# Patient Record
Sex: Male | Born: 2005 | Race: Black or African American | Hispanic: No | Marital: Single | Smoking: Never smoker
Health system: Southern US, Community
[De-identification: ages and names within clinical notes are randomized; demographics above are authoritative.]

## PROBLEM LIST (undated history)

## (undated) HISTORY — PX: NO PAST SURGERIES: SHX2092

---
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
## 2023-05-07 ENCOUNTER — Ambulatory Visit (INDEPENDENT_AMBULATORY_CARE_PROVIDER_SITE_OTHER): Payer: Medicaid Other | Admitting: Pediatrics

## 2023-05-07 ENCOUNTER — Encounter: Payer: Self-pay | Admitting: Pediatrics

## 2023-05-07 VITALS — BP 122/74 | HR 74 | Ht 70.87 in | Wt 263.2 lb

## 2023-05-07 DIAGNOSIS — Z713 Dietary counseling and surveillance: Secondary | ICD-10-CM | POA: Diagnosis not present

## 2023-05-07 DIAGNOSIS — Z0000 Encounter for general adult medical examination without abnormal findings: Secondary | ICD-10-CM

## 2023-05-07 DIAGNOSIS — Z1331 Encounter for screening for depression: Secondary | ICD-10-CM | POA: Diagnosis not present

## 2023-05-07 DIAGNOSIS — E6609 Other obesity due to excess calories: Secondary | ICD-10-CM | POA: Diagnosis not present

## 2023-05-07 DIAGNOSIS — Z6830 Body mass index (BMI) 30.0-30.9, adult: Secondary | ICD-10-CM | POA: Diagnosis not present

## 2023-05-07 DIAGNOSIS — E66811 Obesity, class 1: Secondary | ICD-10-CM

## 2023-05-07 DIAGNOSIS — Z113 Encounter for screening for infections with a predominantly sexual mode of transmission: Secondary | ICD-10-CM | POA: Diagnosis not present

## 2023-05-07 DIAGNOSIS — Z00121 Encounter for routine child health examination with abnormal findings: Secondary | ICD-10-CM

## 2023-05-07 NOTE — Progress Notes (Unsigned)
 Frederick Wagner is a 18 y.o. who presents for a well check. Patient is accompanied by Mother French Ana. Patient and guardian are historians during today's visit.   SUBJECTIVE:  CONCERNS:   None  NUTRITION:   Milk:  Low fat milk, 1 cup occasionally  Soda/Juice/Gatorade:  1 cup Water:  2-3 cups Solids:  Eats fruits, some vegetables, chicken, meats, fish, eggs, beans  EXERCISE:  Works at [employer] and [employer], Artist  ELIMINATION:  Voids multiple times a day; Firm stools every    HOME LIFE:      Patient lives at home with mother, father, brother. Feels safe at home. No guns in the house.  SLEEP:   8 hours SAFETY:  Wears seat belt all the time.   PEER RELATIONS:  Socializes well. (+) Social media  PHQ-9 Adolescent:    03/19/2021    3:30 PM 05/07/2023   11:13 AM  PHQ-Adolescent  Down, depressed, hopeless 0 0  Decreased interest 0 0  Altered sleeping 0 1  Change in appetite 0 0  Tired, decreased energy 0 0  Feeling bad or failure about yourself 0 0  Trouble concentrating 0 0  Moving slowly or fidgety/restless 0 0  Suicidal thoughts 0   PHQ-Adolescent Score 0 1  In the past year have you felt depressed or sad most days, even if you felt okay sometimes? No   If you are experiencing any of the problems on this form, how difficult have these problems made it for you to do your work, take care of things at home or get along with other people? Not difficult at all   Has there been a time in the past month when you have had serious thoughts about ending your own life? No   Have you ever, in your whole life, tried to kill yourself or made a suicide attempt? No       DEVELOPMENT:  WORK: [employer] DRIVING:  yes  Social History   Tobacco Use   Smoking status: Never   Smokeless tobacco: Never  Substance Use Topics   Alcohol use: No   Drug use: No    Social History   Substance and Sexual Activity  Sexual Activity Not on file    History reviewed. No pertinent past  medical history.   Past Surgical History:  Procedure Laterality Date   NO PAST SURGERIES       Family History  Problem Relation Age of Onset   Hypertension Maternal Grandmother     No Known Allergies  Current Outpatient Medications  Medication Sig Dispense Refill   Cholecalciferol (VITAMIN D) 50 MCG (2000 UT) CAPS Take 1 capsule (2,000 Units total) by mouth daily. 30 capsule 2   No current facility-administered medications for this visit.       Review of Systems  Constitutional: Negative.  Negative for activity change and fever.  HENT: Negative.  Negative for ear pain, rhinorrhea and sore throat.   Eyes: Negative.  Negative for pain.  Respiratory: Negative.  Negative for cough, chest tightness and shortness of breath.   Cardiovascular: Negative.  Negative for chest pain.  Gastrointestinal: Negative.  Negative for abdominal pain, constipation, diarrhea and vomiting.  Endocrine: Negative.   Genitourinary: Negative.  Negative for difficulty urinating.  Musculoskeletal: Negative.  Negative for joint swelling.  Skin: Negative.  Negative for rash.  Neurological: Negative.  Negative for headaches.  Psychiatric/Behavioral: Negative.       OBJECTIVE:  Wt Readings from Last 3  Encounters:  05/07/23 263 lb 3.2 oz (119.4 kg) (>99%, Z= 2.61)*  04/23/22 268 lb 3.2 oz (121.7 kg) (>99%, Z= 2.69)*  03/07/22 275 lb 9.6 oz (125 kg) (>99%, Z= 2.78)*   * Growth percentiles are based on CDC (Boys, 2-20 Years) data.   Ht Readings from Last 3 Encounters:  05/07/23 5' 10.87" (1.8 m) (68%, Z= 0.46)*  04/23/22 5' 11.26" (1.81 m) (74%, Z= 0.65)*  03/07/22 5' 11.26" (1.81 m) (75%, Z= 0.66)*   * Growth percentiles are based on CDC (Boys, 2-20 Years) data.    Body mass index is 36.85 kg/m.   98 %ile (Z= 2.13) based on CDC (Boys, 2-20 Years) BMI-for-age based on BMI available on 05/07/2023.  VITALS:  Blood pressure 122/74, pulse 74, height 5' 10.87" (1.8 m), weight 263 lb 3.2 oz (119.4 kg),  SpO2 98%.   Hearing Screening   500Hz  1000Hz  2000Hz  3000Hz  4000Hz  5000Hz  6000Hz  8000Hz   Right ear 20 20 20 20 20 20 20 20   Left ear 20 20 20 20 20 20 20 20    Vision Screening   Right eye Left eye Both eyes  Without correction 20/20 20/20 20/20   With correction        PHYSICAL EXAM: GEN:  Alert, active, no acute distress PSYCH:  Mood: pleasant;  Affect:  full range HEENT:  Normocephalic.  Atraumatic. Optic discs sharp bilaterally. Pupils equally round and reactive to light.  Extraoccular muscles intact.  Tympanic canals clear. Tympanic membranes are pearly gray bilaterally.   Turbinates:  normal ; Tongue midline. No pharyngeal lesions.  Dentition _ NECK:  Supple. Full range of motion.  No thyromegaly.  No lymphadenopathy. CARDIOVASCULAR:  Normal S1, S2.  No murmurs.   CHEST: Normal shape.  SMR _   LUNGS: Clear to auscultation.   ABDOMEN:  Normoactive polyphonic bowel sounds.  No masses.  No hepatosplenomegaly. EXTERNAL GENITALIA:  Normal SMR _ EXTREMITIES:  Full ROM. No cyanosis.  No edema. SKIN:  Well perfused.  No rash NEURO:  +5/5 Strength. CN II-XII intact. Normal gait cycle.   SPINE:  No deformities.  No scoliosis.    ASSESSMENT/PLAN:    Frederick Wagner is a 18 y.o. teen here for [facility]. Patient is alert, active and in NAD. Passed hearing and vision screen. Growth curve reviewed. Immunizations today.   PHQ-9 reviewed with patient. No suicidal or homicidal ideations.   GC/Ch screen sent. Results will be discussed with patient.    IMMUNIZATIONS:  Handout (VIS) provided for each vaccine for the parent to review during this visit. Indications, benefits, contraindications, and side effects of vaccines discussed with parent.  Parent verbally expressed understanding.  Parent _ to the administration of vaccine/vaccines as ordered today.   No orders of the defined types were placed in this encounter.   Lab Orders  No laboratory test(s) ordered today     Anticipatory Guidance     -  Handout on Young Adult Safety given.      - Discussed growth, diet, and exercise.    - Discussed social media use and limiting screen time to 2 hours daily.    - Discussed dangers of substance use.    - Discussed lifelong adult responsibility of pregnancy, STDs, and safe sex practices including abstinence.     - Taught self-breast exam.  Taught self-testicular exam.

## 2023-05-07 NOTE — Patient Instructions (Signed)
 Well Child Nutrition, Teen The following information provides general nutrition recommendations. Talk with a health care provider or a diet and nutrition specialist (dietitian) if you have any questions. Nutrition  The amount of food you need to eat every day depends on your age, sex, size, and activity level. To figure out your daily calorie needs, look for a calorie calculator online or talk with your health care provider. Balanced diet Eat a balanced diet. Try to include: Fruits. Aim for 1-2 cups a day. Examples of 1 cup of fruit include 1 large banana, 1 small apple, 8 large strawberries, 1 large orange,  cup (80 g) dried fruit, or 1 cup (250 mL) of 100% fruit juice. Try to eat fresh or frozen fruits, and avoid fruits that have added sugars. Vegetables. Aim for 2-4 cups a day. Examples of 1 cup of vegetables include 2 medium carrots, 1 large tomato, 2 stalks of celery, or 2 cups (62 g) of raw leafy greens. Try to eat vegetables with a variety of colors. Low-fat or fat-free dairy. Aim for 3 cups a day. Examples of 1 cup of dairy include 8 oz (230 mL) of milk, 8 oz (230 g) of yogurt, or 1 oz (44 g) of natural cheese. Getting enough calcium and vitamin D is important for growth and healthy bones. If you are unable to tolerate dairy (lactose intolerant) or you choose not to consume dairy, you may include fortified soy beverages (soy milk). Grains. Aim for 6-10 "ounce-equivalents" of grain foods (such as pasta, rice, and tortillas) a day. Examples of 1 ounce-equivalent of grains include 1 cup (60 g) of ready-to-eat cereal,  cup (79 g) of cooked rice, or 1 slice of bread. Of the grain foods that you eat each day, aim to include 3-5 ounce-equivalents of whole-grain options. Examples of whole grains include whole wheat, brown rice, wild rice, quinoa, and oats. Lean proteins. Aim for 5-7 ounce-equivalents a day. Eat a variety of protein foods, including lean meats, seafood, poultry, eggs, legumes (beans  and peas), nuts, seeds, and soy products. A cut of meat or fish that is the size of a deck of cards is about 3-4 ounce-equivalents (85 g). Foods that provide 1 ounce-equivalent of protein include 1 egg,  oz (28 g) of nuts or seeds, or 1 tablespoon (16 g) of peanut butter. For more information and options for foods in a balanced diet, visit www.Dis[employer]Nylon.be Tips for healthy snacking A snack should not be the size of a full meal. Eat snacks that have 200 calories or less. Examples include:  whole-wheat pita with  cup (40 g) hummus. 2 or 3 slices of deli [place] wrapped around one cheese stick.  apple with 1 tablespoon (16 g) of peanut butter. 10 baked chips with salsa. Keep cut-up fruits and vegetables available at home and at school so they are easy to eat. Pack healthy snacks the night before or when you pack your lunch. Avoid pre-packaged foods. These tend to be higher in fat, sugar, and salt (sodium). Get involved with shopping, or ask the main food shopper in your family to get healthy snacks that you like. Avoid chips, candy, cake, and soft drinks. Foods to avoid [place] or heavily processed foods, such as hot dogs and microwaveable dinners. Drinks that contain a lot of sugar, such as sports drinks, sodas, and juice. Water is the ideal beverage. Aim to drink six 8-oz (240 mL) glasses of water each day. Foods that contain a lot of fat, sodium, or sugar.  General instructions Make time for regular exercise. Try to be active for 60 minutes every day. Do not skip meals, especially breakfast. Do not hesitate to try new foods. Help with meal prep and learn how to prepare meals. Avoid fad diets. These may affect your mood and growth. If you are worried about your body image, talk with your parents, your health care provider, or another trusted adult like a coach or counselor. You may be at risk for developing an eating disorder. Eating disorders can lead to serious medical problems. Food  allergies may cause you to have a reaction (such as a rash, diarrhea, or vomiting) after eating or drinking. Talk with your health care provider if you have concerns about food allergies. Summary Eat a balanced diet. Include whole grains, fruits, vegetables, proteins, and low-fat dairy. Choose healthy snacks that are 200 calories or less. Drink plenty of water. Be active for 60 minutes or more every day. This information is not intended to replace advice given to you by your health care provider. Make sure you discuss any questions you have with your health care provider. Document Revised: 01/09/2021 Document Reviewed: 01/09/2021 Elsevier Patient Education  2024 [employer].

## 2023-05-08 ENCOUNTER — Encounter: Payer: Self-pay | Admitting: Pediatrics

## 2023-05-08 ENCOUNTER — Telehealth: Payer: Self-pay | Admitting: Pediatrics

## 2023-05-08 NOTE — Telephone Encounter (Signed)
 Kendall forwarded to me yesterday. Letter was mailed out.

## 2023-05-08 NOTE — Telephone Encounter (Signed)
 Discharge patient due to age.

## 2023-05-09 ENCOUNTER — Telehealth: Payer: Self-pay | Admitting: Pediatrics

## 2023-05-09 LAB — GC/CHLAMYDIA PROBE AMP
Chlamydia trachomatis, NAA: NEGATIVE
Neisseria Gonorrhoeae by PCR: NEGATIVE

## 2023-05-09 NOTE — Telephone Encounter (Signed)
 Please inform PATIENT[[phone]] that his Gonorrhea and Chlamydia screen returned negative today. Thank you.

## 2023-05-12 NOTE — Telephone Encounter (Signed)
 Attempted call, [email or web address]

## 2023-05-13 NOTE — Telephone Encounter (Signed)
 Attempted call, [email or web address]

## 2023-05-16 ENCOUNTER — Encounter: Payer: Self-pay | Admitting: Pediatrics

## 2023-05-16 NOTE — Telephone Encounter (Signed)
 Letter mailed

## 2023-05-16 NOTE — Telephone Encounter (Signed)
 Attempted call, [email or web address]

## 2023-05-16 NOTE — Telephone Encounter (Signed)
 Can you send this patient a letter to call our office for his lab results.  Thanks
